# Patient Record
Sex: Female | Born: 1995
Health system: Southern US, Community
[De-identification: ages and names within clinical notes are randomized; demographics above are authoritative.]

## PROBLEM LIST (undated history)

## (undated) DIAGNOSIS — H6090 Unspecified otitis externa, unspecified ear: Secondary | ICD-10-CM

## (undated) DIAGNOSIS — M436 Torticollis: Secondary | ICD-10-CM

## (undated) DIAGNOSIS — J329 Chronic sinusitis, unspecified: Secondary | ICD-10-CM

## (undated) HISTORY — PX: NOSE SURGERY: SHX723

## (undated) HISTORY — DX: Torticollis: M43.6

## (undated) HISTORY — DX: Unspecified otitis externa, unspecified ear: H60.90

## (undated) HISTORY — DX: Chronic sinusitis, unspecified: J32.9

---
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
## 2017-01-30 ENCOUNTER — Ambulatory Visit (INDEPENDENT_AMBULATORY_CARE_PROVIDER_SITE_OTHER): Payer: 59 | Admitting: Nurse Practitioner

## 2017-01-30 ENCOUNTER — Encounter: Payer: Self-pay | Admitting: Nurse Practitioner

## 2017-01-30 VITALS — BP 118/72 | HR 87 | Temp 98.7°F | Resp 16 | Ht 65.0 in | Wt 130.4 lb

## 2017-01-30 DIAGNOSIS — Z23 Encounter for immunization: Secondary | ICD-10-CM | POA: Diagnosis not present

## 2017-01-30 DIAGNOSIS — Z0001 Encounter for general adult medical examination with abnormal findings: Secondary | ICD-10-CM | POA: Diagnosis not present

## 2017-01-30 DIAGNOSIS — N946 Dysmenorrhea, unspecified: Secondary | ICD-10-CM | POA: Diagnosis not present

## 2017-01-30 DIAGNOSIS — Z114 Encounter for screening for human immunodeficiency virus [HIV]: Secondary | ICD-10-CM | POA: Diagnosis not present

## 2017-01-30 DIAGNOSIS — Z3009 Encounter for other general counseling and advice on contraception: Secondary | ICD-10-CM

## 2017-01-30 NOTE — Patient Instructions (Addendum)
Schedule an appointment for a nurse visit in 1-2 months for your HPV and tetanus shot. You can stop by the lab that day for blood work. Well check your blood counts, kidneys, liver, thyroid, and an HIV test.  I have set up a referral for gynecology to discuss birth control options. Our office will call you to schedule this appointment.  You may try ibuprofen 843m three times a day on the day you begin to have cramps, and continue this dosage for 2- 3 days for relief of your cramps.  It was nice to meet you. Welcome to [facility]   Preventive Care 18-39 Years, Female Preventive care refers to lifestyle choices and visits with your health care provider that can promote health and wellness. What does preventive care include?  A yearly physical exam. This is also called an annual well check.  Dental exams once or twice a year.  Routine eye exams. Ask your health care provider how often you should have your eyes checked.  Personal lifestyle choices, including: ? Daily care of your teeth and gums. ? Regular physical activity. ? Eating a healthy diet. ? Avoiding tobacco and drug use. ? Limiting alcohol use. ? Practicing safe sex. ? Taking vitamin and mineral supplements as recommended by your health care provider. What happens during an annual well check? The services and screenings done by your health care provider during your annual well check will depend on your age, overall health, lifestyle risk factors, and family history of disease. Counseling Your health care provider may ask you questions about your:  Alcohol use.  Tobacco use.  Drug use.  Emotional well-being.  Home and relationship well-being.  Sexual activity.  Eating habits.  Work and work [employer]  Method of birth control.  Menstrual cycle.  Pregnancy history.  Screening You may have the following tests or measurements:  Height, weight, and BMI.  Diabetes screening. This is done by checking your  blood sugar (glucose) after you have not eaten for a while (fasting).  Blood pressure.  Lipid and cholesterol levels. These may be checked every 5 years starting at age 19  Skin check.  Hepatitis C blood test.  Hepatitis B blood test.  Sexually transmitted disease (STD) testing.  BRCA-related cancer screening. This may be done if you have a family history of breast, ovarian, tubal, or peritoneal cancers.  Pelvic exam and Pap test. This may be done every 3 years starting at age 19 Starting at age 19 this may be done every 5 years if you have a Pap test in combination with an HPV test.  Discuss your test results, treatment options, and if necessary, the need for more tests with your health care provider. Vaccines Your health care provider may recommend certain vaccines, such as:  Influenza vaccine. This is recommended every year.  Tetanus, diphtheria, and acellular pertussis (Tdap, Td) vaccine. You may need a Td booster every 19 years.  Varicella vaccine. You may need this if you have not been vaccinated.  HPV vaccine. If you are 247or younger, you may need three doses over 6 months.  Measles, mumps, and rubella (MMR) vaccine. You may need at least one dose of MMR. You may also need a second dose.  Pneumococcal 13-valent conjugate (PCV13) vaccine. You may need this if you have certain conditions and were not previously vaccinated.  Pneumococcal polysaccharide (PPSV23) vaccine. You may need one or two doses if you smoke cigarettes or if you have certain conditions.  Meningococcal vaccine. One  dose is recommended if you are age 19-21 years and a first-year college student living in a residence hall, or if you have one of several medical conditions. You may also need additional booster doses.  Hepatitis A vaccine. You may need this if you have certain conditions or if you travel or work in places where you may be exposed to hepatitis A.  Hepatitis B vaccine. You may need this if  you have certain conditions or if you travel or work in places where you may be exposed to hepatitis B.  Haemophilus influenzae type b (Hib) vaccine. You may need this if you have certain risk factors.  Talk to your health care provider about which screenings and vaccines you need and how often you need them. This information is not intended to replace advice given to you by your health care provider. Make sure you discuss any questions you have with your health care provider. Document Released: 05/15/2001 Document Revised: 12/07/2015 Document Reviewed: 01/18/2015 Elsevier Interactive Patient Education  2017 [employer].

## 2017-01-30 NOTE — Progress Notes (Signed)
Subjective:    Patient ID: Dana KnudsenMakenzi L Rodriguez, female    DOB: [birth year], 21 y.o.   MRN: [id number]  HPI [name] is a 21 yo female who presents today to establish care. She is requesting a complete physical  Immunizations: Flu shot-today. Tdap- out of date HPV- 1st dose in series today. Diet: Skips lunch often. Lots of snacks. Breakfast-biscuit. Lunch cheeseburger. Dinner cooks at home. Veggies and fruits daily. Sweet tea and water. Exercise: None. Smoker: Never Vision: not getting vision screenings. Dental: every 6 months for cleanings, xrays  Menstrual cramps- this is an ongoing problem, every month about 3 days prior to cycle onset. This has been occurring for some time now, >several months. The pain is a cramping in her lower back and pelvic area. Her Cycles are regular, without heavy bleeding, lasting about 4 days each month. Denies mood swings, nausea or vomiting. shes tried midol no relief. Once she begins menstruation the cramps resolve.  Review of Systems  Constitutional: Negative for activity change and appetite change.  HENT: Negative for congestion, sinus pain and sinus pressure.   Eyes: Negative for visual disturbance.  Respiratory: Negative for cough and shortness of breath.   Cardiovascular: Negative for chest pain.  Gastrointestinal: Negative for constipation and diarrhea.  Endocrine: Negative for cold intolerance and heat intolerance.  Genitourinary: Negative for difficulty urinating, dysuria and hematuria.  Musculoskeletal: Negative for arthralgias and myalgias.  Skin: Negative for rash.  Allergic/Immunologic: Negative for environmental allergies and food allergies.  Neurological: Positive for headaches. Negative for dizziness and weakness.  Hematological: Does not bruise/bleed easily.  Psychiatric/Behavioral: Negative for sleep disturbance.       Denies depression or anxiety.     History reviewed. No pertinent past medical history.   Social History    Social History  . Marital status: Single    Spouse name: N/A  . Number of children: N/A  . Years of education: N/A   Occupational History  . restaurant    Social History Main Topics  . Smoking status: Never Smoker  . Smokeless tobacco: Never Used  . Alcohol use No  . Drug use: No  . Sexual activity: Yes    Partners: Male    Birth control/ protection: None   Other Topics Concern  . Not on file   Social History Narrative  . No narrative on file    History reviewed. No pertinent surgical history.  Family History  Problem Relation Age of Onset  . Asthma Mother     No Known Allergies  No current outpatient prescriptions on file prior to visit.   No current facility-administered medications on file prior to visit.     BP 118/72 (BP Location: Left Arm, Patient Position: Sitting, Cuff Size: Normal)   Pulse 87   Temp 98.7 F (37.1 C) (Oral)   Resp 16   Ht 5\' 5"  (1.651 m)   Wt 130 lb 6.4 oz (59.1 kg)   SpO2 98%   BMI 21.70 kg/m       Objective:   Physical Exam Constitutional: She is oriented to person, place, and time. She appears well-developed and well-nourished. No distress.  HENT:  Head: Normocephalic and atraumatic.  Right Ear: Tympanic membrane and ear canal normal.  Left Ear: Tympanic membrane and ear canal normal.  Mouth/Throat: Oropharynx is clear and moist.  Eyes: Pupils are equal, round, and reactive to light. No scleral icterus.  Neck: Normal range of motion. No thyromegaly present.  Cardiovascular: Normal rate and regular  rhythm.   No murmur heard. Pulmonary/Chest: Effort normal and breath sounds normal. No respiratory distress. sHe has no wheezes. She has no rales. She exhibits no tenderness.  Abdominal: Soft. Bowel sounds are normal. She exhibits no distension and no mass. There is no tenderness. There is no rebound and no guarding.  Musculoskeletal: She exhibits no edema.  Lymphadenopathy:    She has no cervical adenopathy.  Neurological:  She is alert and oriented to person, place, and time. She has normal patellar reflexes. She exhibits normal muscle tone. Coordination normal. cranial nerves intact. Skin: Skin is warm and dry.  Psychiatric: She has a normal mood and affect. Her behavior is normal. Judgment and thought content normal.      Assessment & Plan:  Dysmenorrhea- monthly, denies heavy bleeding, nausea, vomitng. Cycles are regular. Pain does not improve with midol. She is sexually active without birth control, is not currently desiring pregnancy. We discussed the benefits of birth control in preventing pregnancy and possibly alleviating her menstrual cramps. She worries about remembering to take a daily pill and would prefer to consider other options including IUD. GYN referral place, CBC ordered. Given instructions for ibuprofen, 800mg  2-3 times daily for 2-3 days at onset of cramps each month for pain.

## 2017-01-30 NOTE — Assessment & Plan Note (Signed)
HPV vaccine series initiated today. Flu shot given. She will return in 1-2 months for 2nd HPV vaccine, TDAP. CMET, TSH, HIV screening labs ordered. Shed like to have these drawn when she returns for next vaccines Health maintenance up to date.  We discussed incorporating exercise and healthy foods including fruits and vegetables into her daily routine for health maintenance.  She will return next year for annual physical or sooner if she needs.

## 2017-02-19 ENCOUNTER — Encounter: Payer: Self-pay | Admitting: Women's Health

## 2017-02-19 ENCOUNTER — Ambulatory Visit: Payer: 59 | Admitting: Women's Health

## 2017-02-19 VITALS — BP 118/80 | Ht 65.0 in | Wt 131.0 lb

## 2017-02-19 DIAGNOSIS — Z01419 Encounter for gynecological examination (general) (routine) without abnormal findings: Secondary | ICD-10-CM | POA: Diagnosis not present

## 2017-02-19 DIAGNOSIS — Z113 Encounter for screening for infections with a predominantly sexual mode of transmission: Secondary | ICD-10-CM | POA: Diagnosis not present

## 2017-02-19 NOTE — Progress Notes (Signed)
Dana Rodriguez 1995-08-04 [id number]    History:    Presents for new patient annual exam. Regular monthly cycle, has a backache with her menstrual cycle. Currently not sexually active but desiring contraception. Received first Gardasil about 3 weeks ago and has follow-up scheduled to complete the series.  Past medical history, past surgical history, family history and social history were all reviewed and documented in the EPIC chart. 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 Parents healthy.  ROS:  A ROS was performed and pertinent positives and negatives are included.  Exam:  Vitals:   02/19/17 1135  BP: 118/80  Weight: 131 lb (59.4 kg)  Height: 5\' 5"  (1.651 m)   Body mass index is 21.8 kg/m.   General appearance:  Normal Thyroid:  Symmetrical, normal in size, without palpable masses or nodularity. Respiratory  Auscultation:  Clear without wheezing or rhonchi Cardiovascular  Auscultation:  Regular rate, without rubs, murmurs or gallops  Edema/varicosities:  Not grossly evident Abdominal  Soft,nontender, without masses, guarding or rebound.  Liver/spleen:  No organomegaly noted  Hernia:  None appreciated  Skin  Inspection:  Grossly normal   Breasts: Examined lying and sitting.     Right: Without masses, retractions, discharge or axillary adenopathy.     Left: Without masses, retractions, discharge or axillary adenopathy. Gentitourinary   Inguinal/mons:  Normal without inguinal adenopathy  External genitalia:  Normal  BUS/Urethra/Skene's glands:  Normal  Vagina:  Normal  Cervix:  Normal  Uterus:  normal in size, shape and contour.  Midline and mobile  Adnexa/parametria:     Rt: Without masses or tenderness.   Lt: Without masses or tenderness.  Anus and perineum: Normal    Assessment/Plan:  21 y.o. S W[place] G0 for annual exam with no complaints.  Regular monthly cycle/condoms Gardasil-primary care/completing series STD screen Contraception  management  Plan: Contraception options reviewed would like to proceed with Mirena IUD, reviewed slight risk for infection, perforation, hemorrhage. Will check coverage, schedule Dr. Audie BoxFontaine with next cycle. Continue abstinence until placed. SBE's, exercise, calcium rich diet, MVI daily encouraged. Reviewed campus safety. CBC, GC/Chlamydia, HIV, hep B, C, RPR.    Harrington Challengerancy J Enedina Pair [facility] CenterWHNP, 12:30 PM 02/19/2017

## 2017-02-19 NOTE — Patient Instructions (Addendum)
Call office first day of cycle for mirena iud dr Phineas Real will place Levonorgestrel intrauterine device (IUD) What is this medicine? LEVONORGESTREL IUD (LEE voe nor jes trel) is a contraceptive (birth control) device. The device is placed inside the uterus by a healthcare professional. It is used to prevent pregnancy. This device can also be used to treat heavy bleeding that occurs during your period. This medicine may be used for other purposes; ask your health care provider or pharmacist if you have questions. COMMON BRAND NAME(S): Minette Headland What should I tell my health care provider before I take this medicine? They need to know if you have any of these conditions: -abnormal Pap smear -cancer of the breast, uterus, or cervix -diabetes -endometritis -genital or pelvic infection now or in the past -have more than one sexual partner or your partner has more than one partner -heart disease -history of an ectopic or tubal pregnancy -immune system problems -IUD in place -liver disease or tumor -problems with blood clots or take blood-thinners -seizures -use intravenous drugs -uterus of unusual shape -vaginal bleeding that has not been explained -an unusual or allergic reaction to levonorgestrel, other hormones, silicone, or polyethylene, medicines, foods, dyes, or preservatives -pregnant or trying to get pregnant -breast-feeding How should I use this medicine? This device is placed inside the uterus by a health care professional. Talk to your pediatrician regarding the use of this medicine in children. Special care may be needed. Overdosage: If you think you have taken too much of this medicine contact a poison control center or emergency room at once. NOTE: This medicine is only for you. Do not share this medicine with others. What if I miss a dose? This does not apply. Depending on the brand of device you have inserted, the device will need to be replaced every 3  to 5 years if you wish to continue using this type of birth control. What may interact with this medicine? Do not take this medicine with any of the following medications: -amprenavir -bosentan -fosamprenavir This medicine may also interact with the following medications: -aprepitant -armodafinil -barbiturate medicines for inducing sleep or treating seizures -bexarotene -boceprevir -griseofulvin -medicines to treat seizures like carbamazepine, ethotoin, felbamate, oxcarbazepine, phenytoin, topiramate -modafinil -pioglitazone -rifabutin -rifampin -rifapentine -some medicines to treat HIV infection like atazanavir, efavirenz, indinavir, lopinavir, nelfinavir, tipranavir, ritonavir -St. John's wort -warfarin This list may not describe all possible interactions. Give your health care provider a list of all the medicines, herbs, non-prescription drugs, or dietary supplements you use. Also tell them if you smoke, drink alcohol, or use illegal drugs. Some items may interact with your medicine. What should I watch for while using this medicine? Visit your doctor or health care professional for regular check ups. See your doctor if you or your partner has sexual contact with others, becomes HIV positive, or gets a sexual transmitted disease. This product does not protect you against HIV infection (AIDS) or other sexually transmitted diseases. You can check the placement of the IUD yourself by reaching up to the top of your vagina with clean fingers to feel the threads. Do not pull on the threads. It is a good habit to check placement after each menstrual period. Call your doctor right away if you feel more of the IUD than just the threads or if you cannot feel the threads at all. The IUD may come out by itself. You may become pregnant if the device comes out. If you notice that the IUD has  come out use a backup birth control method like condoms and call your health care provider. Using tampons  will not change the position of the IUD and are okay to use during your period. This IUD can be safely scanned with magnetic resonance imaging (MRI) only under specific conditions. Before you have an MRI, tell your healthcare provider that you have an IUD in place, and which type of IUD you have in place. What side effects may I notice from receiving this medicine? Side effects that you should report to your doctor or health care professional as soon as possible: -allergic reactions like skin rash, itching or hives, swelling of the face, lips, or tongue -fever, flu-like symptoms -genital sores -high blood pressure -no menstrual period for 6 weeks during use -pain, swelling, warmth in the leg -pelvic pain or tenderness -severe or sudden headache -signs of pregnancy -stomach cramping -sudden shortness of breath -trouble with balance, talking, or walking -unusual vaginal bleeding, discharge -yellowing of the eyes or skin Side effects that usually do not require medical attention (report to your doctor or health care professional if they continue or are bothersome): -acne -breast pain -change in sex drive or performance -changes in weight -cramping, dizziness, or faintness while the device is being inserted -headache -irregular menstrual bleeding within first 3 to 6 months of use -nausea This list may not describe all possible side effects. Call your doctor for medical advice about side effects. You may report side effects to FDA at 1-800-FDA-1088. Where should I keep my medicine? This does not apply. NOTE: This sheet is a summary. It may not cover all possible information. If you have questions about this medicine, talk to your doctor, pharmacist, or health care provider.  2018 Elsevier/Gold Standard (2015-12-30 14:14:56) Health Maintenance, Female Adopting a healthy lifestyle and getting preventive care can go a long way to promote health and wellness. Talk with your health care  provider about what schedule of regular examinations is right for you. This is a good chance for you to check in with your provider about disease prevention and staying healthy. In between checkups, there are plenty of things you can do on your own. Experts have done a lot of research about which lifestyle changes and preventive measures are most likely to keep you healthy. Ask your health care provider for more information. Weight and diet Eat a healthy diet  Be sure to include plenty of vegetables, fruits, low-fat dairy products, and lean protein.  Do not eat a lot of foods high in solid fats, added sugars, or salt.  Get regular exercise. This is one of the most important things you can do for your health. ? Most adults should exercise for at least 150 minutes each week. The exercise should increase your heart rate and make you sweat (moderate-intensity exercise). ? Most adults should also do strengthening exercises at least twice a week. This is in addition to the moderate-intensity exercise.  Maintain a healthy weight  Body mass index (BMI) is a measurement that can be used to identify possible weight problems. It estimates body fat based on height and weight. Your health care provider can help determine your BMI and help you achieve or maintain a healthy weight.  For females 3 years of age and older: ? A BMI below 18.5 is considered underweight. ? A BMI of 18.5 to 24.9 is normal. ? A BMI of 25 to 29.9 is considered overweight. ? A BMI of 30 and above is considered obese.  Watch levels of cholesterol and blood lipids  You should start having your blood tested for lipids and cholesterol at 19 years of age, then have this test every 5 years.  You may need to have your cholesterol levels checked more often if: ? Your lipid or cholesterol levels are high. ? You are older than 19 years of age. ? You are at high risk for heart disease.  Cancer screening Lung Cancer  Lung cancer  screening is recommended for adults 26-35 years old who are at high risk for lung cancer because of a history of smoking.  A yearly low-dose CT scan of the lungs is recommended for people who: ? Currently smoke. ? Have quit within the past 15 years. ? Have at least a 30-pack-year history of smoking. A pack year is smoking an average of one pack of cigarettes a day for 1 year.  Yearly screening should continue until it has been 15 years since you quit.  Yearly screening should stop if you develop a health problem that would prevent you from having lung cancer treatment.  Breast Cancer  Practice breast self-awareness. This means understanding how your breasts normally appear and feel.  It also means doing regular breast self-exams. Let your health care provider know about any changes, no matter how small.  If you are in your 20s or 30s, you should have a clinical breast exam (CBE) by a health care provider every 1-3 years as part of a regular health exam.  If you are 5 or older, have a CBE every year. Also consider having a breast X-ray (mammogram) every year.  If you have a family history of breast cancer, talk to your health care provider about genetic screening.  If you are at high risk for breast cancer, talk to your health care provider about having an MRI and a mammogram every year.  Breast cancer gene (BRCA) assessment is recommended for women who have family members with BRCA-related cancers. BRCA-related cancers include: ? Breast. ? Ovarian. ? Tubal. ? Peritoneal cancers.  Results of the assessment will determine the need for genetic counseling and BRCA1 and BRCA2 testing.  Cervical Cancer Your health care provider may recommend that you be screened regularly for cancer of the pelvic organs (ovaries, uterus, and vagina). This screening involves a pelvic examination, including checking for microscopic changes to the surface of your cervix (Pap test). You may be encouraged to  have this screening done every 3 years, beginning at age 4.  For women ages 49-65, health care providers may recommend pelvic exams and Pap testing every 3 years, or they may recommend the Pap and pelvic exam, combined with testing for human papilloma virus (HPV), every 5 years. Some types of HPV increase your risk of cervical cancer. Testing for HPV may also be done on women of any age with unclear Pap test results.  Other health care providers may not recommend any screening for nonpregnant women who are considered low risk for pelvic cancer and who do not have symptoms. Ask your health care provider if a screening pelvic exam is right for you.  If you have had past treatment for cervical cancer or a condition that could lead to cancer, you need Pap tests and screening for cancer for at least 20 years after your treatment. If Pap tests have been discontinued, your risk factors (such as having a new sexual partner) need to be reassessed to determine if screening should resume. Some women have medical problems that increase  the chance of getting cervical cancer. In these cases, your health care provider may recommend more frequent screening and Pap tests.  Colorectal Cancer  This type of cancer can be detected and often prevented.  Routine colorectal cancer screening usually begins at 19 years of age and continues through 19 years of age.  Your health care provider may recommend screening at an earlier age if you have risk factors for colon cancer.  Your health care provider may also recommend using home test kits to check for hidden blood in the stool.  A small camera at the end of a tube can be used to examine your colon directly (sigmoidoscopy or colonoscopy). This is done to check for the earliest forms of colorectal cancer.  Routine screening usually begins at age 38.  Direct examination of the colon should be repeated every 5-10 years through 19 years of age. However, you may need to be  screened more often if early forms of precancerous polyps or small growths are found.  Skin Cancer  Check your skin from head to toe regularly.  Tell your health care provider about any new moles or changes in moles, especially if there is a change in a mole's shape or color.  Also tell your health care provider if you have a mole that is larger than the size of a pencil eraser.  Always use sunscreen. Apply sunscreen liberally and repeatedly throughout the day.  Protect yourself by wearing long sleeves, pants, a wide-brimmed hat, and sunglasses whenever you are outside.  Heart disease, diabetes, and high blood pressure  High blood pressure causes heart disease and increases the risk of stroke. High blood pressure is more likely to develop in: ? People who have blood pressure in the high end of the normal range (130-139/85-89 mm Hg). ? People who are overweight or obese. ? People who are African American.  If you are 30-71 years of age, have your blood pressure checked every 3-5 years. If you are 44 years of age or older, have your blood pressure checked every year. You should have your blood pressure measured twice-once when you are at a hospital or clinic, and once when you are not at a hospital or clinic. Record the average of the two measurements. To check your blood pressure when you are not at a hospital or clinic, you can use: ? An automated blood pressure machine at a pharmacy. ? A home blood pressure monitor.  If you are between 65 years and 60 years old, ask your health care provider if you should take aspirin to prevent strokes.  Have regular diabetes screenings. This involves taking a blood sample to check your fasting blood sugar level. ? If you are at a normal weight and have a low risk for diabetes, have this test once every three years after 19 years of age. ? If you are overweight and have a high risk for diabetes, consider being tested at a younger age or more  often. Preventing infection Hepatitis B  If you have a higher risk for hepatitis B, you should be screened for this virus. You are considered at high risk for hepatitis B if: ? You were born in a country where hepatitis B is common. Ask your health care provider which countries are considered high risk. ? Your parents were born in a high-risk country, and you have not been immunized against hepatitis B (hepatitis B vaccine). ? You have HIV or AIDS. ? You use needles to inject street  drugs. ? You live with someone who has hepatitis B. ? You have had sex with someone who has hepatitis B. ? You get hemodialysis treatment. ? You take certain medicines for conditions, including cancer, organ transplantation, and autoimmune conditions.  Hepatitis C  Blood testing is recommended for: ? Everyone born from [birth year] through 1963. ? Anyone with known risk factors for hepatitis C.  Sexually transmitted infections (STIs)  You should be screened for sexually transmitted infections (STIs) including gonorrhea and chlamydia if: ? You are sexually active and are younger than 19 years of age. ? You are older than 19 years of age and your health care provider tells you that you are at risk for this type of infection. ? Your sexual activity has changed since you were last screened and you are at an increased risk for chlamydia or gonorrhea. Ask your health care provider if you are at risk.  If you do not have HIV, but are at risk, it may be recommended that you take a prescription medicine daily to prevent HIV infection. This is called pre-exposure prophylaxis (PrEP). You are considered at risk if: ? You are sexually active and do not regularly use condoms or know the HIV status of your partner(s). ? You take drugs by injection. ? You are sexually active with a partner who has HIV.  Talk with your health care provider about whether you are at high risk of being infected with HIV. If you choose to begin PrEP,  you should first be tested for HIV. You should then be tested every 3 months for as long as you are taking PrEP. Pregnancy  If you are premenopausal and you may become pregnant, ask your health care provider about preconception counseling.  If you may become pregnant, take 400 to 800 micrograms (mcg) of folic acid every day.  If you want to prevent pregnancy, talk to your health care provider about birth control (contraception). Osteoporosis and menopause  Osteoporosis is a disease in which the bones lose minerals and strength with aging. This can result in serious bone fractures. Your risk for osteoporosis can be identified using a bone density scan.  If you are 23 years of age or older, or if you are at risk for osteoporosis and fractures, ask your health care provider if you should be screened.  Ask your health care provider whether you should take a calcium or vitamin D supplement to lower your risk for osteoporosis.  Menopause may have certain physical symptoms and risks.  Hormone replacement therapy may reduce some of these symptoms and risks. Talk to your health care provider about whether hormone replacement therapy is right for you. Follow these instructions at home:  Schedule regular health, dental, and eye exams.  Stay current with your immunizations.  Do not use any tobacco products including cigarettes, chewing tobacco, or electronic cigarettes.  If you are pregnant, do not drink alcohol.  If you are breastfeeding, limit how much and how often you drink alcohol.  Limit alcohol intake to no more than 1 drink per day for nonpregnant women. One drink equals 12 ounces of beer, 5 ounces of wine, or 1 ounces of hard liquor.  Do not use street drugs.  Do not share needles.  Ask your health care provider for help if you need support or information about quitting drugs.  Tell your health care provider if you often feel depressed.  Tell your health care provider if you  have ever been abused or do not  feel safe at home. This information is not intended to replace advice given to you by your health care provider. Make sure you discuss any questions you have with your health care provider. Document Released: 10/02/2010 Document Revised: 08/25/2015 Document Reviewed: 12/21/2014 Elsevier Interactive Patient Education  [employer].

## 2017-02-20 LAB — HEPATITIS B SURFACE ANTIGEN: HEP B S AG: NONREACTIVE

## 2017-02-20 LAB — CBC WITH DIFFERENTIAL/PLATELET
BASOS PCT: 0.7 %
Basophils Absolute: 41 cells/uL (ref 0–200)
Eosinophils Absolute: 122 cells/uL (ref 15–500)
Eosinophils Relative: 2.1 %
HCT: 39.9 % (ref 35.0–45.0)
Hemoglobin: 13.9 g/dL (ref 11.7–15.5)
Lymphs Abs: 1949 cells/uL (ref 850–3900)
MCH: 30 pg (ref 27.0–33.0)
MCHC: 34.8 g/dL (ref 32.0–36.0)
MCV: 86.2 fL (ref 80.0–100.0)
MONOS PCT: 11.7 %
MPV: 9.3 fL (ref 7.5–12.5)
Neutro Abs: 3010 cells/uL (ref 1500–7800)
Neutrophils Relative %: 51.9 %
PLATELETS: 285 10*3/uL (ref 140–400)
RBC: 4.63 10*6/uL (ref 3.80–5.10)
RDW: 12.2 % (ref 11.0–15.0)
TOTAL LYMPHOCYTE: 33.6 %
WBC mixed population: 679 cells/uL (ref 200–950)
WBC: 5.8 10*3/uL (ref 3.8–10.8)

## 2017-02-20 LAB — HEPATITIS C ANTIBODY
HEP C AB: NONREACTIVE
SIGNAL TO CUT-OFF: 0.04 (ref <1.00)

## 2017-02-20 LAB — C. TRACHOMATIS/N. GONORRHOEAE RNA
C. trachomatis RNA, TMA: NOT DETECTED
N. gonorrhoeae RNA, TMA: NOT DETECTED

## 2017-02-20 LAB — RPR: RPR Ser Ql: NONREACTIVE

## 2017-02-20 LAB — HIV ANTIBODY (ROUTINE TESTING W REFLEX): HIV: NONREACTIVE

## 2017-02-23 LAB — URINALYSIS W MICROSCOPIC + REFLEX CULTURE
Bilirubin Urine: NEGATIVE
Glucose, UA: NEGATIVE
HGB URINE DIPSTICK: NEGATIVE
HYALINE CAST: NONE SEEN /LPF
KETONES UR: NEGATIVE
Nitrites, Initial: NEGATIVE
PROTEIN: NEGATIVE
SPECIFIC GRAVITY, URINE: 1.016 (ref 1.001–1.03)
pH: 8 (ref 5.0–8.0)

## 2017-02-23 LAB — URINE CULTURE
MICRO NUMBER:: 81314335
SPECIMEN QUALITY:: ADEQUATE

## 2017-02-23 LAB — CULTURE INDICATED

## 2017-02-25 ENCOUNTER — Other Ambulatory Visit: Payer: Self-pay | Admitting: Women's Health

## 2017-02-25 MED ORDER — NITROFURANTOIN MONOHYD MACRO 100 MG PO CAPS: 100.0000 mg | ORAL_CAPSULE | Freq: Two times a day (BID) | ORAL | 0 refills | Status: DC

## 2017-02-25 MED FILL — NITROFURANTOIN MONO-MCR 100: 100 | 7 days supply | Qty: 14 | Fill #0

## 2017-02-26 ENCOUNTER — Encounter: Payer: Self-pay | Admitting: Nurse Practitioner

## 2017-02-26 DIAGNOSIS — H6090 Unspecified otitis externa, unspecified ear: Secondary | ICD-10-CM | POA: Insufficient documentation

## 2017-02-26 DIAGNOSIS — M436 Torticollis: Secondary | ICD-10-CM | POA: Insufficient documentation

## 2017-02-26 DIAGNOSIS — J329 Chronic sinusitis, unspecified: Secondary | ICD-10-CM | POA: Insufficient documentation

## 2017-03-27 ENCOUNTER — Ambulatory Visit: Payer: 59 | Admitting: Nurse Practitioner

## 2017-03-27 ENCOUNTER — Ambulatory Visit (INDEPENDENT_AMBULATORY_CARE_PROVIDER_SITE_OTHER): Payer: 59 | Admitting: *Deleted

## 2017-03-27 DIAGNOSIS — Z23 Encounter for immunization: Secondary | ICD-10-CM | POA: Diagnosis not present

## 2017-04-15 ENCOUNTER — Telehealth: Payer: Self-pay | Admitting: *Deleted

## 2017-04-15 MED ORDER — NORGESTIMATE-ETH ESTRADIOL 0.25-35 MG-MCG PO TABS: 1.0000 | ORAL_TABLET | Freq: Every day | ORAL | 3 refills | Status: DC

## 2017-04-15 MED FILL — NORG-ETHIN ESTRA 0.25-0.035: 0.25-35 | 84 days supply | Qty: 84 | Fill #0

## 2017-04-15 NOTE — Telephone Encounter (Signed)
Molli KnockOkay, Sprintec take daily, start on the Sunday after cycle starts or cycle starts on a Monday or Tuesday start then. (review purpose of the Sunday start so she doesn't have her cycles on the weekend, nothing medical.) Please E scribe Sprintec 1 daily #3 packs with 3 refills. Review first month is not contraceptive.

## 2017-04-15 NOTE — Telephone Encounter (Signed)
Patient informed, Rx sent.  

## 2017-04-15 NOTE — Telephone Encounter (Signed)
Patient called requesting Rx for birth control pills,decided not to proceed with mirena IUD. Please advise

## 2017-06-19 ENCOUNTER — Telehealth: Payer: Self-pay | Admitting: *Deleted

## 2017-06-19 MED ORDER — NORETHINDRONE ACET-ETHINYL EST 1-20 MG-MCG PO TABS: 1.0000 | ORAL_TABLET | Freq: Every day | ORAL | 3 refills | Status: DC

## 2017-06-19 NOTE — Telephone Encounter (Signed)
Please call we will try a lower estrogen which may help prevent headaches, Loestrin finish her current pack and then start please call in for her.  Have her call if she has continued problems.

## 2017-06-19 NOTE — Telephone Encounter (Signed)
Pt called asking if birth control pills could be switch from Sprintec c/o headaches not daily, on 3rd package of pills. Pt said she never had headaches before pills. Please advise

## 2017-06-19 NOTE — Telephone Encounter (Signed)
What dose? Loestrin 1/10 or 1/20?

## 2017-06-19 NOTE — Telephone Encounter (Signed)
Pt informed to finish current pack out and start new pack. Rx sent.

## 2017-06-19 NOTE — Telephone Encounter (Signed)
Have her try the Loestrin 1/20,  Usually the Loestrin 1/20 should be free with her insurance and we could try that first and if continued headaches and we can try the Loestrin 1/10.  many of the pharmacies have a coupon to get it at a reduced rate,

## 2017-07-23 ENCOUNTER — Ambulatory Visit (INDEPENDENT_AMBULATORY_CARE_PROVIDER_SITE_OTHER): Payer: 59

## 2017-07-23 DIAGNOSIS — Z23 Encounter for immunization: Secondary | ICD-10-CM | POA: Diagnosis not present

## 2017-07-23 DIAGNOSIS — Z299 Encounter for prophylactic measures, unspecified: Secondary | ICD-10-CM | POA: Diagnosis not present

## 2017-10-09 ENCOUNTER — Telehealth: Payer: Self-pay | Admitting: *Deleted

## 2017-10-09 NOTE — Telephone Encounter (Signed)
Patient called has been taking Loestrin 1/20 mg since March. States pack doesn't have 4th week which is her pill free week, has cycle on 2nd week of pack and cycle on pill free week, takes pills daily/ on time. Patient asked if she should have another birth control pills prescribed? Please advise

## 2017-10-09 NOTE — Telephone Encounter (Signed)
Please call and review she has had no missed pills?  Have her take at home UPT and if negative could try a different pill Loovral, take daily and call if continued problems.

## 2017-10-10 MED ORDER — NORGESTREL-ETHINYL ESTRADIOL 0.3-30 MG-MCG PO TABS: 1.0000 | ORAL_TABLET | Freq: Every day | ORAL | 1 refills | Status: DC

## 2017-10-10 NOTE — Telephone Encounter (Signed)
thanks

## 2017-10-10 NOTE — Telephone Encounter (Signed)
Patient said she has not been sexually active, no missed pills, will finish this pack of pill and start in new pack. Rx sent

## 2018-01-09 ENCOUNTER — Ambulatory Visit (INDEPENDENT_AMBULATORY_CARE_PROVIDER_SITE_OTHER): Payer: 59 | Admitting: *Deleted

## 2018-01-09 DIAGNOSIS — Z23 Encounter for immunization: Secondary | ICD-10-CM | POA: Diagnosis not present

## 2018-03-13 ENCOUNTER — Other Ambulatory Visit: Payer: Self-pay

## 2018-03-14 ENCOUNTER — Other Ambulatory Visit: Payer: Self-pay | Admitting: Women's Health

## 2018-03-17 ENCOUNTER — Telehealth: Payer: Self-pay | Admitting: *Deleted

## 2018-03-17 MED ORDER — NORGESTREL-ETHINYL ESTRADIOL 0.3-30 MG-MCG PO TABS: 1.0000 | ORAL_TABLET | Freq: Every day | ORAL | 0 refills | Status: DC

## 2018-03-17 NOTE — Telephone Encounter (Signed)
Patient has annual exam scheduled on 05/06/2018, needs refill on birth control pills. Rx sent .

## 2018-03-27 DIAGNOSIS — R0981 Nasal congestion: Secondary | ICD-10-CM | POA: Diagnosis not present

## 2018-03-27 DIAGNOSIS — J00 Acute nasopharyngitis [common cold]: Secondary | ICD-10-CM | POA: Diagnosis not present

## 2018-05-06 ENCOUNTER — Encounter: Payer: Self-pay | Admitting: Women's Health

## 2018-05-06 ENCOUNTER — Ambulatory Visit: Payer: 59 | Admitting: Women's Health

## 2018-05-06 VITALS — BP 108/74 | Ht 65.0 in | Wt 134.4 lb

## 2018-05-06 DIAGNOSIS — Z23 Encounter for immunization: Secondary | ICD-10-CM | POA: Diagnosis not present

## 2018-05-06 DIAGNOSIS — Z01419 Encounter for gynecological examination (general) (routine) without abnormal findings: Secondary | ICD-10-CM | POA: Diagnosis not present

## 2018-05-06 DIAGNOSIS — Z113 Encounter for screening for infections with a predominantly sexual mode of transmission: Secondary | ICD-10-CM | POA: Diagnosis not present

## 2018-05-06 MED ORDER — SCOPOLAMINE 1 MG/3DAYS TD PT72: 1.0000 | MEDICATED_PATCH | TRANSDERMAL | 0 refills | Status: DC

## 2018-05-06 MED ORDER — NORGESTREL-ETHINYL ESTRADIOL 0.3-30 MG-MCG PO TABS: 1.0000 | ORAL_TABLET | Freq: Every day | ORAL | 4 refills | Status: DC

## 2018-05-06 NOTE — Progress Notes (Signed)
Dana Rodriguez 1995/11/03 [id number]    History:    Presents for annual exam.  Monthly cycle on lo ovral without complaint.  Gardasil series completed.  New partner.  Past medical history, past surgical history, family history and social history were all reviewed and documented in the EPIC chart.  Working in catering.   mother healthy, father diabetes  ROS:  A ROS was performed and pertinent positives and negatives are included.  Exam:  Vitals:   05/06/18 1033  BP: 108/74  Weight: 134 lb 6.4 oz (61 kg)  Height: 5\' 5"  (1.651 m)   Body mass index is 22.37 kg/m.   General appearance:  Normal Thyroid:  Symmetrical, normal in size, without palpable masses or nodularity. Respiratory  Auscultation:  Clear without wheezing or rhonchi Cardiovascular  Auscultation:  Regular rate, without rubs, murmurs or gallops  Edema/varicosities:  Not grossly evident Abdominal  Soft,nontender, without masses, guarding or rebound.  Liver/spleen:  No organomegaly noted  Hernia:  None appreciated  Skin  Inspection:  Grossly normal   Breasts: Examined lying and sitting.     Right: Without masses, retractions, discharge or axillary adenopathy.     Left: Without masses, retractions, discharge or axillary adenopathy. Gentitourinary   Inguinal/mons:  Normal without inguinal adenopathy  External genitalia:  Normal  BUS/Urethra/Skene's glands:  Normal  Vagina:  Normal  Cervix:  Normal  Uterus:  normal in size, shape and contour.  Midline and mobile  Adnexa/parametria:     Rt: Without masses or tenderness.   Lt: Without masses or tenderness.  Anus and perineum: Normal    Assessment/Plan:  23 y.o. S W[place] G0 for annual exam with no complaints.  Monthly cycle on LoOvral STD screen  Plan: Due for Tdap, given, reviewed it is good for 10 years.  Scopolamine patch 1.5 mg patch given is going on a cruise next week proper use and administration given.  LoOvral prescription, proper use, slight risk for  blood clots and strokes reviewed.  Condoms encouraged until permanent partner.  SBEs, exercise, calcium rich foods, MVI daily encouraged.  CBC, glucose, GC/chlamydia, HIV, hep B, C, RPR.    Harrington Challenger [facility], 2:03 PM 05/06/2018

## 2018-05-06 NOTE — Patient Instructions (Signed)

## 2018-05-07 LAB — CBC WITH DIFFERENTIAL/PLATELET
Absolute Monocytes: 557 {cells}/uL (ref 200–950)
Basophils Absolute: 21 {cells}/uL (ref 0–200)
Basophils Relative: 0.4 %
Eosinophils Absolute: 58 {cells}/uL (ref 15–500)
Eosinophils Relative: 1.1 %
HCT: 43.9 % (ref 35.0–45.0)
Hemoglobin: 14.6 g/dL (ref 11.7–15.5)
Lymphs Abs: 1908 {cells}/uL (ref 850–3900)
MCH: 28.7 pg (ref 27.0–33.0)
MCHC: 33.3 g/dL (ref 32.0–36.0)
MCV: 86.2 fL (ref 80.0–100.0)
MPV: 9.4 fL (ref 7.5–12.5)
Monocytes Relative: 10.5 %
Neutro Abs: 2756 {cells}/uL (ref 1500–7800)
Neutrophils Relative %: 52 %
Platelets: 285 Thousand/uL (ref 140–400)
RBC: 5.09 Million/uL (ref 3.80–5.10)
RDW: 12.5 % (ref 11.0–15.0)
Total Lymphocyte: 36 %
WBC: 5.3 Thousand/uL (ref 3.8–10.8)

## 2018-05-07 LAB — C. TRACHOMATIS/N. GONORRHOEAE RNA
C. trachomatis RNA, TMA: NOT DETECTED
N. gonorrhoeae RNA, TMA: NOT DETECTED

## 2018-05-07 LAB — HIV ANTIBODY (ROUTINE TESTING W REFLEX): HIV 1&2 Ab, 4th Generation: NONREACTIVE

## 2018-05-07 LAB — HEPATITIS C ANTIBODY
HEP C AB: NONREACTIVE
SIGNAL TO CUT-OFF: 0.03 (ref <1.00)

## 2018-05-07 LAB — SYPHILIS: RPR W/REFLEX TO RPR TITER AND TREPONEMAL ANTIBODIES, TRADITIONAL SCREENING AND DIAGNOSIS ALGORITHM: RPR Ser Ql: NONREACTIVE

## 2018-05-07 LAB — HEPATITIS B SURFACE ANTIGEN: Hepatitis B Surface Ag: NONREACTIVE

## 2018-05-07 LAB — GLUCOSE, RANDOM: Glucose, Bld: 82 mg/dL (ref 65–99)

## 2019-05-11 ENCOUNTER — Other Ambulatory Visit: Payer: Self-pay

## 2019-05-12 ENCOUNTER — Ambulatory Visit (INDEPENDENT_AMBULATORY_CARE_PROVIDER_SITE_OTHER): Payer: 59 | Admitting: Women's Health

## 2019-05-12 ENCOUNTER — Encounter: Payer: Self-pay | Admitting: Women's Health

## 2019-05-12 VITALS — BP 122/78 | Ht 65.0 in | Wt 138.0 lb

## 2019-05-12 DIAGNOSIS — Z01419 Encounter for gynecological examination (general) (routine) without abnormal findings: Secondary | ICD-10-CM | POA: Diagnosis not present

## 2019-05-12 LAB — CBC WITH DIFFERENTIAL/PLATELET
Absolute Monocytes: 705 cells/uL (ref 200–950)
Basophils Absolute: 30 cells/uL (ref 0–200)
Basophils Relative: 0.6 %
Eosinophils Absolute: 80 cells/uL (ref 15–500)
Eosinophils Relative: 1.6 %
HCT: 43 % (ref 35.0–45.0)
Hemoglobin: 14.6 g/dL (ref 11.7–15.5)
Lymphs Abs: 1945 cells/uL (ref 850–3900)
MCH: 29.6 pg (ref 27.0–33.0)
MCHC: 34 g/dL (ref 32.0–36.0)
MCV: 87.2 fL (ref 80.0–100.0)
MPV: 8.9 fL (ref 7.5–12.5)
Monocytes Relative: 14.1 %
Neutro Abs: 2240 cells/uL (ref 1500–7800)
Neutrophils Relative %: 44.8 %
Platelets: 280 10*3/uL (ref 140–400)
RBC: 4.93 10*6/uL (ref 3.80–5.10)
RDW: 12.1 % (ref 11.0–15.0)
Total Lymphocyte: 38.9 %
WBC: 5 10*3/uL (ref 3.8–10.8)

## 2019-05-12 MED ORDER — NORGESTREL-ETHINYL ESTRADIOL 0.3-30 MG-MCG PO TABS: 1.0000 | ORAL_TABLET | Freq: Every day | ORAL | 4 refills | Status: DC

## 2019-05-12 NOTE — Patient Instructions (Signed)
It was good to see you today Multivitamin daily  Health Maintenance, Female Adopting a healthy lifestyle and getting preventive care are important in promoting health and wellness. Ask your health care provider about:  The right schedule for you to have regular tests and exams.  Things you can do on your own to prevent diseases and keep yourself healthy. What should I know about diet, weight, and exercise? Eat a healthy diet   Eat a diet that includes plenty of vegetables, fruits, low-fat dairy products, and lean protein.  Do not eat a lot of foods that are high in solid fats, added sugars, or sodium. Maintain a healthy weight Body mass index (BMI) is used to identify weight problems. It estimates body fat based on height and weight. Your health care provider can help determine your BMI and help you achieve or maintain a healthy weight. Get regular exercise Get regular exercise. This is one of the most important things you can do for your health. Most adults should:  Exercise for at least 150 minutes each week. The exercise should increase your heart rate and make you sweat (moderate-intensity exercise).  Do strengthening exercises at least twice a week. This is in addition to the moderate-intensity exercise.  Spend less time sitting. Even light physical activity can be beneficial. Watch cholesterol and blood lipids Have your blood tested for lipids and cholesterol at 22 years of age, then have this test every 5 years. Have your cholesterol levels checked more often if:  Your lipid or cholesterol levels are high.  You are older than 22 years of age.  You are at high risk for heart disease. What should I know about cancer screening? Depending on your health history and family history, you may need to have cancer screening at various ages. This may include screening for:  Breast cancer.  Cervical cancer.  Colorectal cancer.  Skin cancer.  Lung cancer. What should I know  about heart disease, diabetes, and high blood pressure? Blood pressure and heart disease  High blood pressure causes heart disease and increases the risk of stroke. This is more likely to develop in people who have high blood pressure readings, are of African descent, or are overweight.  Have your blood pressure checked: ? Every 3-5 years if you are 18-39 years of age. ? Every year if you are 40 years old or older. Diabetes Have regular diabetes screenings. This checks your fasting blood sugar level. Have the screening done:  Once every three years after age 40 if you are at a normal weight and have a low risk for diabetes.  More often and at a younger age if you are overweight or have a high risk for diabetes. What should I know about preventing infection? Hepatitis B If you have a higher risk for hepatitis B, you should be screened for this virus. Talk with your health care provider to find out if you are at risk for hepatitis B infection. Hepatitis C Testing is recommended for:  Everyone born from 1945 through 1965.  Anyone with known risk factors for hepatitis C. Sexually transmitted infections (STIs)  Get screened for STIs, including gonorrhea and chlamydia, if: ? You are sexually active and are younger than 22 years of age. ? You are older than 22 years of age and your health care provider tells you that you are at risk for this type of infection. ? Your sexual activity has changed since you were last screened, and you are at increased risk   chlamydia or gonorrhea. Ask your health care provider if you are at risk.  Ask your health care provider about whether you are at high risk for HIV. Your health care provider may recommend a prescription medicine to help prevent HIV infection. If you choose to take medicine to prevent HIV, you should first get tested for HIV. You should then be tested every 3 months for as long as you are taking the medicine. Pregnancy  If you are about  to stop having your period (premenopausal) and you may become pregnant, seek counseling before you get pregnant.  Take 400 to 800 micrograms (mcg) of folic acid every day if you become pregnant.  Ask for birth control (contraception) if you want to prevent pregnancy. Osteoporosis and menopause Osteoporosis is a disease in which the bones lose minerals and strength with aging. This can result in bone fractures. If you are 88 years old or older, or if you are at risk for osteoporosis and fractures, ask your health care provider if you should:  Be screened for bone loss.  Take a calcium or vitamin D supplement to lower your risk of fractures.  Be given hormone replacement therapy (HRT) to treat symptoms of menopause. Follow these instructions at home: Lifestyle  Do not use any products that contain nicotine or tobacco, such as cigarettes, e-cigarettes, and chewing tobacco. If you need help quitting, ask your health care provider.  Do not use street drugs.  Do not share needles.  Ask your health care provider for help if you need support or information about quitting drugs. Alcohol use  Do not drink alcohol if: ? Your health care provider tells you not to drink. ? You are pregnant, may be pregnant, or are planning to become pregnant.  If you drink alcohol: ? Limit how much you use to 0-1 drink a day. ? Limit intake if you are breastfeeding.  Be aware of how much alcohol is in your drink. In the [place] one drink equals one 12 oz bottle of beer (355 mL), one 5 oz glass of wine (148 mL), or one 1 oz glass of hard liquor (44 mL). General instructions  Schedule regular health, dental, and eye exams.  Stay current with your vaccines.  Tell your health care provider if: ? You often feel depressed. ? You have ever been abused or do not feel safe at home. Summary  Adopting a healthy lifestyle and getting preventive care are important in promoting health and wellness.  Follow your  health care provider's instructions about healthy diet, exercising, and getting tested or screened for diseases.  Follow your health care provider's instructions on monitoring your cholesterol and blood pressure. This information is not intended to replace advice given to you by your health care provider. Make sure you discuss any questions you have with your health care provider. Document Revised: 03/12/2018 Document Reviewed: 03/12/2018 Elsevier Patient Education  2020 [employer].

## 2019-05-12 NOTE — Progress Notes (Signed)
Dana Rodriguez Nov 07, 1995 [id number]    History:    Presents for annual exam.  Monthly cycle on lo/Orval without complaint.  Same partner denies need for STD screen.  Gardasil series completed   Past medical history, past surgical history, family history and social history were all reviewed and documented in the EPIC chart.  Planning to attend [employer] [facility] [employer] for radiology technology.  Mother healthy, father diabetes.  ROS:  A ROS was performed and pertinent positives and negatives are included.  Exam:  Vitals:   05/12/19 0903  BP: 122/78  Weight: 138 lb (62.6 kg)  Height: 5\' 5"  (1.651 m)   Body mass index is 22.96 kg/m.   General appearance:  Normal Thyroid:  Symmetrical, normal in size, without palpable masses or nodularity. Respiratory  Auscultation:  Clear without wheezing or rhonchi Cardiovascular  Auscultation:  Regular rate, without rubs, murmurs or gallops  Edema/varicosities:  Not grossly evident Abdominal  Soft,nontender, without masses, guarding or rebound.  Liver/spleen:  No organomegaly noted  Hernia:  None appreciated  Skin  Inspection:  Grossly normal   Breasts: Examined lying and sitting.     Right: Without masses, retractions, discharge or axillary adenopathy.     Left: Without masses, retractions, discharge or axillary adenopathy. Gentitourinary   Inguinal/mons:  Normal without inguinal adenopathy  External genitalia:  Normal  BUS/Urethra/Skene's glands:  Normal  Vagina:  Normal  Cervix:  Normal  Uterus:  Normal shape and contour.  Midline and mobile  Adnexa/parametria:     Rt: Without masses or tenderness.   Lt: Without masses or tenderness.  Anus and perineum: Normal   Assessment/Plan:  24 y.o. S WF G0 for annual exam with no complaints of vaginal discharge, urinary symptoms or abdominal pain.  Monthly cycle on lo/Ovral without complaint  Plan:.Lo/Orval prescription, proper use given and reviewed slight risk for blood clots and strokes.  Condoms  encouraged until permanent partner.  SBEs, exercise, calcium rich foods, MVI daily encouraged.  Campus safety reviewed.  CBC, Pap, Pap screening guidelines reviewed.    12-22-1983 [facility], 9:08 AM 05/12/2019

## 2019-05-13 LAB — PAP IG W/ RFLX HPV ASCU

## 2019-05-15 ENCOUNTER — Other Ambulatory Visit: Payer: Self-pay

## 2019-05-15 LAB — URINALYSIS, COMPLETE W/RFL CULTURE
Bilirubin Urine: NEGATIVE
Glucose, UA: NEGATIVE
Hyaline Cast: NONE SEEN /LPF
Ketones, ur: NEGATIVE
Leukocyte Esterase: NEGATIVE
Nitrites, Initial: NEGATIVE
Protein, ur: NEGATIVE
Specific Gravity, Urine: 1.021 (ref 1.001–1.03)
pH: 6.5 (ref 5.0–8.0)

## 2019-05-15 LAB — URINE CULTURE
MICRO NUMBER:: 10135311
SPECIMEN QUALITY:: ADEQUATE

## 2019-05-15 LAB — CULTURE INDICATED

## 2019-05-15 MED ORDER — NITROFURANTOIN MONOHYD MACRO 100 MG PO CAPS: 100.0000 mg | ORAL_CAPSULE | Freq: Two times a day (BID) | ORAL | 0 refills | Status: DC

## 2019-08-24 ENCOUNTER — Telehealth: Payer: 59 | Admitting: Family

## 2019-08-24 DIAGNOSIS — W57XXXA Bitten or stung by nonvenomous insect and other nonvenomous arthropods, initial encounter: Secondary | ICD-10-CM | POA: Diagnosis not present

## 2019-08-24 DIAGNOSIS — S80869A Insect bite (nonvenomous), unspecified lower leg, initial encounter: Secondary | ICD-10-CM | POA: Diagnosis not present

## 2019-08-24 MED ORDER — TRIAMCINOLONE ACETONIDE 0.5 % EX OINT: 1.0000 "application " | TOPICAL_OINTMENT | Freq: Two times a day (BID) | CUTANEOUS | 0 refills | Status: DC

## 2019-08-24 NOTE — Progress Notes (Signed)
E Visit for Insect Sting  Thank you for describing the insect sting for [place].  Here is how we plan to help!  Based on the information you have shared with me it looks like you have: An uncomplicated insect sting that just occurred and can be closely followed using the instructions in your care plan.  The 2 greatest risks from insect stings are allergic reaction, which can be fatal in some people and infection, which is more common and less serious.  Bees, wasps, yellow jackets, and hornets belong to a class of insects called Hymenoptera.  Most insect stings cause only minor discomfort.  Stings can happen anywhere on the body and can be painful.  Most stings are from honey bees or yellow jackets.  Fire ants can sting multiple times.  The sites of the stings are more likely to become infected.    Based on your information I have: and Provided a home care guide for insect stings and instructions on when to call for help. I have prescribed you Kenalog cream that you will apply twice a day.   What can be used to prevent Insect Stings?   Insect repellant with at least 20% DEET.    Wearing long pants and shirts with socks and shoes.    Wear dark or drab-colored clothes rather than bright colors.    Avoid using perfumes and hair sprays; these attract insects.  HOME CARE ADVICE:  1. Stinger removal:  The stinger looks like a tiny black dot in the sting.  Use a fingernail, credit card edge, or knife-edge to scrape it off.  Don't pull it out because it squeezes out more venom.  If the stinger is below the skin surface, leave it alone.  It will be shed with normal skin healing. 2. Use cold compresses to the area of the sting for 10-20 minutes.  You may repeat this as needed to relieve symptoms of pain and swelling. 3.  For pain relief, take acetominophen 650 mg 4-6 hours as needed or ibuprofen 400 mg every 6-8 hours as needed or naproxen 250-500 mg every 12 hours as needed. 4.  You can also  use hydrocortisone cream 0.5% or 1% up to 4 times daily as needed for itching. 5.  If the sting becomes very itchy, take Benadryl 25-50 mg, follow directions on box. 6.  Wash the area 2-3 times daily with antibacterial soap and warm water. 7. Call your Doctor if:  Fever, a severe headache, or rash occur in the next 2 weeks.  Sting area begins to look infected.  Redness and swelling worsens after home treatment.  Your current symptoms become worse.    MAKE SURE YOU:   Understand these instructions.  Will watch your condition.  Will get help right away if you are not doing well or get worse.  Thank you for choosing an e-visit. Your e-visit answers were reviewed by a board certified advanced clinical practitioner to complete your personal care plan. Depending upon the condition, your plan could have included both over the counter or prescription medications. Please review your pharmacy choice. Be sure that the pharmacy you have chosen is open so that you can pick up your prescription now.  If there is a problem you may message your provider in [place] to have the prescription routed to another pharmacy. Your safety is important to [place]. If you have drug allergies check your prescription carefully.  For the next 24 hours, you can use MyChart to ask questions  about today's visit, request a non-urgent call back, or ask for a work or school excuse from your e-visit provider. You will get an email in the next two days asking about your experience. I hope that your e-visit has been valuable and will speed your recovery.  Approximately 5 minutes was spent documenting and reviewing patient's chart.

## 2019-08-25 MED FILL — TRIAMCINOLONE 0.5% OINTMENT: 0.5 | 14 days supply | Qty: 30 | Fill #0

## 2019-10-27 ENCOUNTER — Ambulatory Visit: Payer: 59 | Admitting: Nurse Practitioner

## 2019-11-09 ENCOUNTER — Other Ambulatory Visit: Payer: Self-pay

## 2019-11-09 ENCOUNTER — Ambulatory Visit: Payer: 59 | Admitting: Nurse Practitioner

## 2019-11-09 ENCOUNTER — Encounter: Payer: Self-pay | Admitting: Nurse Practitioner

## 2019-11-09 VITALS — BP 122/80

## 2019-11-09 DIAGNOSIS — Z3141 Encounter for fertility testing: Secondary | ICD-10-CM | POA: Diagnosis not present

## 2019-11-09 NOTE — Progress Notes (Signed)
   Acute Office Visit  Subjective:    Patient ID: Dana Rodriguez, female    DOB: Nov 03, 1995, 24 y.o.   MRN: [id number]   HPI 24 y.o. presents today to discuss fertility testing. Not currently trying to conceive but she is concerned with getting pregnant in the future. Her employer is requiring the Covid vaccine and she has concerns that it will effect her fertility. Regular monthly cycle on Lo/Ovral.   ROS:  A ROS was performed and pertinent positives and negatives are included.     Objective:    Physical Exam : Appears well  BP 122/80 (BP Location: Right Arm, Patient Position: Sitting, Cuff Size: Normal)   LMP 10/26/2019  Wt Readings from Last 3 Encounters:  05/12/19 138 lb (62.6 kg)  05/06/18 134 lb 6.4 oz (61 kg)  02/19/17 131 lb (59.4 kg) (57 %, Z= 0.19)*   * Growth percentiles are based on CDC (Girls, 2-20 Years) data.        Assessment & Plan:   Problem List Items Addressed This Visit    None    Visit Diagnoses    Encounter for fertility testing    -  Primary   Relevant Orders   Estradiol   Follicle stimulating hormone   Luteinizing hormone   Progesterone      Plan: Discussed recommendations for Covid vaccine and lack of current evidence to support risks for infertility. Also discussed that typically we do not worry about infertility until it has been 1 year trying to conceive without pregnancy. It would be difficult for her to come in multiple times for specific-day hormone testing so we will check them next week when she is on her placebo week. She is agreeable to plan.     [name] [facility], 3:54 PM 11/09/2019

## 2019-11-19 ENCOUNTER — Other Ambulatory Visit: Payer: Self-pay

## 2019-11-19 ENCOUNTER — Other Ambulatory Visit: Payer: 59

## 2019-11-19 DIAGNOSIS — Z3141 Encounter for fertility testing: Secondary | ICD-10-CM

## 2019-11-19 LAB — FOLLICLE STIMULATING HORMONE: FSH: 4 m[IU]/mL

## 2019-11-19 LAB — LUTEINIZING HORMONE: LH: 2.5 m[IU]/mL

## 2019-11-19 LAB — PROGESTERONE: Progesterone: <0.5 ng/mL

## 2019-11-19 LAB — ESTRADIOL: Estradiol: 27 pg/mL

## 2020-05-11 ENCOUNTER — Encounter: Payer: 59 | Admitting: Women's Health

## 2020-05-16 ENCOUNTER — Encounter: Payer: Self-pay | Admitting: Nurse Practitioner

## 2020-05-16 ENCOUNTER — Ambulatory Visit: Payer: 59 | Admitting: Nurse Practitioner

## 2020-05-16 ENCOUNTER — Other Ambulatory Visit: Payer: Self-pay

## 2020-05-16 ENCOUNTER — Telehealth: Payer: Self-pay | Admitting: *Deleted

## 2020-05-16 VITALS — BP 124/80 | Ht 65.0 in | Wt 157.0 lb

## 2020-05-16 DIAGNOSIS — Z3041 Encounter for surveillance of contraceptive pills: Secondary | ICD-10-CM

## 2020-05-16 DIAGNOSIS — R110 Nausea: Secondary | ICD-10-CM | POA: Diagnosis not present

## 2020-05-16 DIAGNOSIS — Z01419 Encounter for gynecological examination (general) (routine) without abnormal findings: Secondary | ICD-10-CM

## 2020-05-16 DIAGNOSIS — L858 Other specified epidermal thickening: Secondary | ICD-10-CM | POA: Diagnosis not present

## 2020-05-16 MED ORDER — SCOPOLAMINE 1 MG/3DAYS TD PT72: 1.0000 | MEDICATED_PATCH | TRANSDERMAL | 0 refills | Status: DC

## 2020-05-16 MED ORDER — NORGESTREL-ETHINYL ESTRADIOL 0.3-30 MG-MCG PO TABS: 1.0000 | ORAL_TABLET | Freq: Every day | ORAL | 3 refills | Status: DC

## 2020-05-16 NOTE — Progress Notes (Signed)
   Dana Rodriguez [birth year] [id number]   History:  25 y.o. G0 presents for annual exam. OCPs. Received Gardasil series. Normal pap history. She is going on a cruise in April and has had sea sickness in the past, so she would like dramamine patches. Sexually active with long-term partner.   Gynecologic History Patient's last menstrual period was 05/02/2020. Period Cycle (Days): 28 Period Duration (Days): 5 Period Pattern: Regular Menstrual Flow: Moderate Menstrual Control: Maxi pad,Tampon Dysmenorrhea: (!) Mild Dysmenorrhea Symptoms: Cramping Contraception/Family planning: OCP (estrogen/progesterone)  Health Maintenance Last Pap: 05/12/2019. Results were: normal Last mam/mogram: N/A Last colonoscopy: N/A Last Dexa: N/A  Past medical history, past surgical history, family history and social history were all reviewed and documented in the EPIC chart.  ROS:  A ROS was performed and pertinent positives and negatives are included.  Exam:  Vitals:   05/16/20 0942  BP: 124/80  Weight: 157 lb (71.2 kg)  Height: 5\' 5"  (1.651 m)   Body mass index is 26.13 kg/m.  General appearance:  Normal Thyroid:  Symmetrical, normal in size, without palpable masses or nodularity. Respiratory  Auscultation:  Clear without wheezing or rhonchi Cardiovascular  Auscultation:  Regular rate, without rubs, murmurs or gallops  Edema/varicosities:  Not grossly evident Abdominal  Soft,nontender, without masses, guarding or rebound.  Liver/spleen:  No organomegaly noted  Hernia:  None appreciated  Skin  Inspection:  Grossly normal   Breasts: Examined lying and sitting.   Right: Without masses, retractions, discharge or axillary adenopathy.   Left: Without masses, retractions, discharge or axillary adenopathy. Gentitourinary   Inguinal/mons:  Normal without inguinal adenopathy  External genitalia:  Normal  BUS/Urethra/Skene's glands:  Normal  Vagina:  Normal  Cervix:  Normal  Uterus:  Normal  in size, shape and contour.  Midline and mobile  Adnexa/parametria:     Rt: Without masses or tenderness.   Lt: Without masses or tenderness.  Anus and perineum: Normal  Assessment/Plan:  25 y.o. G0 for annual exam.   Well female exam with routine gynecological exam - Education provided on SBEs, importance of preventative screenings, current guidelines, high calcium diet, regular exercise, and multivitamin daily.  Encounter for surveillance of contraceptive pills - Plan: norgestrel-ethinyl estradiol (LO/OVRAL) 0.3-30 MG-MCG tablet daily. Taking as prescribed. Refill x 1 year provided.   Nausea - Plan: scopolamine (TRANSDERM-SCOP) 1 MG/3DAYS. She is aware of proper use. She is going on a cruise and has a history of seasickness.   Keratosis pilaris - bilateral upper arms. She has had this for years and has tried OTC topical treatments with no improvement. We will send dermatology referral.   Screening for cervical cancer - Normal pap history. Will repeat at 3-year interval per guidelines.   Follow up in 1 year for annual.     06-25-1984 [facility], 9:57 AM 05/16/2020

## 2020-05-16 NOTE — Patient Instructions (Signed)
Health Maintenance, Female Adopting a healthy lifestyle and getting preventive care are important in promoting health and wellness. Ask your health care provider about:  The right schedule for you to have regular tests and exams.  Things you can do on your own to prevent diseases and keep yourself healthy. What should I know about diet, weight, and exercise? Eat a healthy diet  Eat a diet that includes plenty of vegetables, fruits, low-fat dairy products, and lean protein.  Do not eat a lot of foods that are high in solid fats, added sugars, or sodium.   Maintain a healthy weight Body mass index (BMI) is used to identify weight problems. It estimates body fat based on height and weight. Your health care provider can help determine your BMI and help you achieve or maintain a healthy weight. Get regular exercise Get regular exercise. This is one of the most important things you can do for your health. Most adults should:  Exercise for at least 150 minutes each week. The exercise should increase your heart rate and make you sweat (moderate-intensity exercise).  Do strengthening exercises at least twice a week. This is in addition to the moderate-intensity exercise.  Spend less time sitting. Even light physical activity can be beneficial. Watch cholesterol and blood lipids Have your blood tested for lipids and cholesterol at 23 years of age, then have this test every 5 years. Have your cholesterol levels checked more often if:  Your lipid or cholesterol levels are high.  You are older than 23 years of age.  You are at high risk for heart disease. What should I know about cancer screening? Depending on your health history and family history, you may need to have cancer screening at various ages. This may include screening for:  Breast cancer.  Cervical cancer.  Colorectal cancer.  Skin cancer.  Lung cancer. What should I know about heart disease, diabetes, and high blood  pressure? Blood pressure and heart disease  High blood pressure causes heart disease and increases the risk of stroke. This is more likely to develop in people who have high blood pressure readings, are of African descent, or are overweight.  Have your blood pressure checked: ? Every 3-5 years if you are 18-39 years of age. ? Every year if you are 40 years old or older. Diabetes Have regular diabetes screenings. This checks your fasting blood sugar level. Have the screening done:  Once every three years after age 40 if you are at a normal weight and have a low risk for diabetes.  More often and at a younger age if you are overweight or have a high risk for diabetes. What should I know about preventing infection? Hepatitis B If you have a higher risk for hepatitis B, you should be screened for this virus. Talk with your health care provider to find out if you are at risk for hepatitis B infection. Hepatitis C Testing is recommended for:  Everyone born from 1945 through 1965.  Anyone with known risk factors for hepatitis C. Sexually transmitted infections (STIs)  Get screened for STIs, including gonorrhea and chlamydia, if: ? You are sexually active and are younger than 24 years of age. ? You are older than 24 years of age and your health care provider tells you that you are at risk for this type of infection. ? Your sexual activity has changed since you were last screened, and you are at increased risk for chlamydia or gonorrhea. Ask your health care provider   if you are at risk.  Ask your health care provider about whether you are at high risk for HIV. Your health care provider may recommend a prescription medicine to help prevent HIV infection. If you choose to take medicine to prevent HIV, you should first get tested for HIV. You should then be tested every 3 months for as long as you are taking the medicine. Pregnancy  If you are about to stop having your period (premenopausal) and  you may become pregnant, seek counseling before you get pregnant.  Take 400 to 800 micrograms (mcg) of folic acid every day if you become pregnant.  Ask for birth control (contraception) if you want to prevent pregnancy. Osteoporosis and menopause Osteoporosis is a disease in which the bones lose minerals and strength with aging. This can result in bone fractures. If you are 65 years old or older, or if you are at risk for osteoporosis and fractures, ask your health care provider if you should:  Be screened for bone loss.  Take a calcium or vitamin D supplement to lower your risk of fractures.  Be given hormone replacement therapy (HRT) to treat symptoms of menopause. Follow these instructions at home: Lifestyle  Do not use any products that contain nicotine or tobacco, such as cigarettes, e-cigarettes, and chewing tobacco. If you need help quitting, ask your health care provider.  Do not use street drugs.  Do not share needles.  Ask your health care provider for help if you need support or information about quitting drugs. Alcohol use  Do not drink alcohol if: ? Your health care provider tells you not to drink. ? You are pregnant, may be pregnant, or are planning to become pregnant.  If you drink alcohol: ? Limit how much you use to 0-1 drink a day. ? Limit intake if you are breastfeeding.  Be aware of how much alcohol is in your drink. In the [place] one drink equals one 12 oz bottle of beer (355 mL), one 5 oz glass of wine (148 mL), or one 1 oz glass of hard liquor (44 mL). General instructions  Schedule regular health, dental, and eye exams.  Stay current with your vaccines.  Tell your health care provider if: ? You often feel depressed. ? You have ever been abused or do not feel safe at home. Summary  Adopting a healthy lifestyle and getting preventive care are important in promoting health and wellness.  Follow your health care provider's instructions about healthy  diet, exercising, and getting tested or screened for diseases.  Follow your health care provider's instructions on monitoring your cholesterol and blood pressure. This information is not intended to replace advice given to you by your health care provider. Make sure you discuss any questions you have with your health care provider. Document Revised: 03/12/2018 Document Reviewed: 03/12/2018 Elsevier Patient Education  2021 [employer]  

## 2020-05-16 NOTE — Telephone Encounter (Signed)
-----   Message from [name], NP sent at 05/16/2020 10:37 AM EST ----- Please send referral to dermatology for Keratosis Pilaris. She prefers one in [place] if available. Thank you.

## 2020-09-22 ENCOUNTER — Telehealth: Payer: 59 | Admitting: Physician Assistant

## 2020-09-22 DIAGNOSIS — B9689 Other specified bacterial agents as the cause of diseases classified elsewhere: Secondary | ICD-10-CM | POA: Diagnosis not present

## 2020-09-22 DIAGNOSIS — N760 Acute vaginitis: Secondary | ICD-10-CM

## 2020-09-23 MED ORDER — METRONIDAZOLE 500 MG PO TABS: 500.0000 mg | ORAL_TABLET | Freq: Two times a day (BID) | ORAL | 0 refills | Status: AC

## 2020-09-23 NOTE — Progress Notes (Signed)

## 2021-04-13 ENCOUNTER — Other Ambulatory Visit: Payer: Self-pay

## 2021-04-13 DIAGNOSIS — Z3041 Encounter for surveillance of contraceptive pills: Secondary | ICD-10-CM

## 2021-04-13 MED ORDER — NORGESTREL-ETHINYL ESTRADIOL 0.3-30 MG-MCG PO TABS: 1.0000 | ORAL_TABLET | Freq: Every day | ORAL | 0 refills | Status: DC

## 2021-04-13 NOTE — Telephone Encounter (Signed)
Patient called and scheduled AEX 06/13/21 with TW.  Last AEX 05/16/20 with TW.  Needs refill.

## 2021-04-27 ENCOUNTER — Ambulatory Visit: Payer: 59 | Admitting: Nurse Practitioner

## 2021-05-17 ENCOUNTER — Ambulatory Visit: Payer: 59 | Admitting: Nurse Practitioner

## 2021-05-23 ENCOUNTER — Other Ambulatory Visit: Payer: Self-pay

## 2021-05-23 DIAGNOSIS — Z3041 Encounter for surveillance of contraceptive pills: Secondary | ICD-10-CM

## 2021-06-12 NOTE — Progress Notes (Unsigned)
° °  Dana Rodriguez May 06, 1995 [id number]   History:  26 y.o. G0 presents for annual exam. OCPs. Monthly cycles on OCPs.  Normal pap history. Received Gardasil series.   Gynecologic History No LMP recorded.   Contraception/Family planning: OCP (estrogen/progesterone) Sexually active: ***  Health Maintenance Last Pap: 05/12/2019. Results were: Normal Last mam/mogram: Not indicated Last colonoscopy: Not indicated Last Dexa: Not indicated  Past medical history, past surgical history, family history and social history were all reviewed and documented in the EPIC chart.  ROS:  A ROS was performed and pertinent positives and negatives are included.  Exam:  There were no vitals filed for this visit.  There is no height or weight on file to calculate BMI.  General appearance:  Normal Thyroid:  Symmetrical, normal in size, without palpable masses or nodularity. Respiratory  Auscultation:  Clear without wheezing or rhonchi Cardiovascular  Auscultation:  Regular rate, without rubs, murmurs or gallops  Edema/varicosities:  Not grossly evident Abdominal  Soft,nontender, without masses, guarding or rebound.  Liver/spleen:  No organomegaly noted  Hernia:  None appreciated  Skin  Inspection:  Grossly normal   Breasts: Not indicated per guidelines Genitourinary   Inguinal/mons:  Normal without inguinal adenopathy  External genitalia:  Normal appearing vulva with no masses, tenderness, or lesions  BUS/Urethra/Skene's glands:  Normal  Vagina:  Normal appearing with normal color and discharge, no lesions  Cervix:  Normal appearing without discharge or lesions  Uterus:  Normal in size, shape and contour.  Midline and mobile, nontender  Adnexa/parametria:     Rt: Normal in size, without masses or tenderness.   Lt: Normal in size, without masses or tenderness.  Anus and perineum: Normal  Digital rectal exam: Not indicated  Patient informed chaperone available to be present for breast  and pelvic exam. Patient has requested no chaperone to be present. Patient has been advised what will be completed during breast and pelvic exam.   Assessment/Plan:  26 y.o. G0 for annual exam.   Well female exam with routine gynecological exam - Education provided on SBEs, importance of preventative screenings, current guidelines, high calcium diet, regular exercise, and multivitamin daily.  Keratosis pilaris - bilateral upper arms. She has had this for years and has tried OTC topical treatments with no improvement. We will send dermatology referral.   Screening for cervical cancer - Normal pap history. Will repeat at 3-year interval per guidelines.   Follow up in 1 year for annual.     [name] [facility], 3:31 PM 06/12/2021

## 2021-06-13 ENCOUNTER — Encounter: Payer: Self-pay | Admitting: Nurse Practitioner

## 2021-06-13 ENCOUNTER — Ambulatory Visit (INDEPENDENT_AMBULATORY_CARE_PROVIDER_SITE_OTHER): Payer: 59 | Admitting: Nurse Practitioner

## 2021-06-13 ENCOUNTER — Other Ambulatory Visit: Payer: Self-pay

## 2021-06-13 VITALS — BP 116/70 | Ht 64.0 in | Wt 177.0 lb

## 2021-06-13 DIAGNOSIS — N898 Other specified noninflammatory disorders of vagina: Secondary | ICD-10-CM | POA: Diagnosis not present

## 2021-06-13 DIAGNOSIS — B3731 Acute candidiasis of vulva and vagina: Secondary | ICD-10-CM

## 2021-06-13 DIAGNOSIS — Z3041 Encounter for surveillance of contraceptive pills: Secondary | ICD-10-CM | POA: Diagnosis not present

## 2021-06-13 DIAGNOSIS — Z01419 Encounter for gynecological examination (general) (routine) without abnormal findings: Secondary | ICD-10-CM | POA: Diagnosis not present

## 2021-06-13 LAB — WET PREP FOR TRICH, YEAST, CLUE

## 2021-06-13 MED ORDER — NORGESTREL-ETHINYL ESTRADIOL 0.3-30 MG-MCG PO TABS: 1.0000 | ORAL_TABLET | Freq: Every day | ORAL | 3 refills | Status: AC

## 2021-06-13 MED ORDER — FLUCONAZOLE 150 MG PO TABS: 150.0000 mg | ORAL_TABLET | ORAL | 0 refills | Status: AC

## 2021-07-19 ENCOUNTER — Other Ambulatory Visit: Payer: Self-pay | Admitting: Nurse Practitioner

## 2021-07-19 DIAGNOSIS — Z3041 Encounter for surveillance of contraceptive pills: Secondary | ICD-10-CM

## 2022-06-18 ENCOUNTER — Ambulatory Visit: Payer: 59 | Admitting: Nurse Practitioner

## 2022-07-25 ENCOUNTER — Other Ambulatory Visit: Payer: Self-pay

## 2022-07-25 DIAGNOSIS — Z3041 Encounter for surveillance of contraceptive pills: Secondary | ICD-10-CM

## 2022-07-25 NOTE — Telephone Encounter (Signed)
RF request received for Cryselle (feneric Lo/Ovral) #[id number].  Last AEX 06/13/21.  No appointment is scheduled.  RF denied, needs appointment.

## 2022-07-26 ENCOUNTER — Other Ambulatory Visit: Payer: Self-pay

## 2022-07-26 DIAGNOSIS — Z3041 Encounter for surveillance of contraceptive pills: Secondary | ICD-10-CM

## 2022-07-26 NOTE — Telephone Encounter (Signed)
Last AEX 06/13/2021--pt was scheduled for AEX on 06/18/2022. However, cancelled appt d/t pt moved. Will send msg to appt desk and have them confirm before refusing request x2.

## 2022-07-27 NOTE — Telephone Encounter (Signed)
07/26/2022: Angelyn Punt D, CMA Refill came to [place] in error."   Will refuse and route to provider for final review.
